# Patient Record
Sex: Female | Born: 1985 | Race: White | Hispanic: No | Marital: Single | Smoking: Never smoker
Health system: Southern US, Community
[De-identification: ages and names within clinical notes are randomized; demographics above are authoritative.]

## PROBLEM LIST (undated history)

## (undated) DIAGNOSIS — E063 Autoimmune thyroiditis: Secondary | ICD-10-CM

## (undated) HISTORY — PX: OTHER SURGICAL HISTORY: SHX169

---

## 2007-02-11 ENCOUNTER — Emergency Department (HOSPITAL_COMMUNITY): Admission: EM | Admit: 2007-02-11 | Discharge: 2007-02-11 | Payer: Self-pay | Admitting: Emergency Medicine

## 2007-02-17 ENCOUNTER — Ambulatory Visit (HOSPITAL_COMMUNITY): Admission: RE | Admit: 2007-02-17 | Discharge: 2007-02-18 | Payer: Self-pay | Admitting: Otolaryngology

## 2017-11-29 ENCOUNTER — Encounter: Payer: Self-pay | Admitting: Podiatry

## 2017-11-29 ENCOUNTER — Ambulatory Visit (INDEPENDENT_AMBULATORY_CARE_PROVIDER_SITE_OTHER): Payer: Medicaid Other

## 2017-11-29 ENCOUNTER — Ambulatory Visit: Payer: Medicaid Other | Admitting: Podiatry

## 2017-11-29 VITALS — BP 96/63 | HR 79 | Ht 67.0 in | Wt 158.0 lb

## 2017-11-29 DIAGNOSIS — M722 Plantar fascial fibromatosis: Secondary | ICD-10-CM | POA: Diagnosis not present

## 2017-11-29 MED ORDER — MELOXICAM 15 MG PO TABS: 15.0000 mg | ORAL_TABLET | Freq: Every day | ORAL | 0 refills | Status: DC

## 2017-11-29 NOTE — Progress Notes (Addendum)
  Subjective:  Patient ID: Kayla Hicks, female    DOB: [birth year],  MRN: [id number]  Chief Complaint  Patient presents with  . Foot Pain    Right foot pain pain moves around been having for years also has pain in left but is bearable     31 y.o. female presents with the above complaint.  Reports right foot pain greater than left.  States that the pain is in different locations of the foot but states it is worst on the right heel.  Also reports forefoot pain.  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   Current Outpatient Medications:  .  levothyroxine (SYNTHROID, LEVOTHROID) 75 MCG tablet, Take 75 mcg by mouth daily before breakfast., Disp: , Rfl:  .  sertraline (ZOLOFT) 100 MG tablet, Take 100 mg by mouth daily., Disp: , Rfl:  .  meloxicam (MOBIC) 15 MG tablet, Take 1 tablet (15 mg total) by mouth daily., Disp: 30 tablet, Rfl: 0  Allergies  Allergen Reactions  . Citric Acid Itching and Swelling   Review of Systems negative except as noted in HPI Objective:   Vitals:   11/29/17 1132  BP: 96/63  Pulse: 79   General AA&O x3. Normal mood and affect.  Vascular Dorsalis pedis and posterior tibial pulses  present 2+ bilaterally  Capillary refill normal to all digits. Pedal hair growth normal.  Neurologic Epicritic sensation grossly present.  Dermatologic No open lesions. Interspaces clear of maceration. Nails well groomed and normal in appearance.  Orthopedic: MMT 5/5 in dorsiflexion, plantarflexion, inversion, and eversion. Normal joint ROM without pain or crepitus. Pain to palpation right medial calcaneal tuber   She was taking reviewed.  No acute osseous abnormality.  No fracture dislocations. Assessment & Plan:  Patient was evaluated and treated and all questions answered.  Plantar Fasciitis, right - XR reviewed as above.  - Educated on icing and stretching.  - Injection delivered to the plantar fascia as below. -Rx meloxicam  Procedure: Injection  Tendon/Ligament Location: Right plantar fascia at the glabrous junction; medial approach. Skin Prep: Alcohol. Injectate: 1 cc 0.5% marcaine plain, 1 cc dexamethasone phosphate, 0.5 cc kenalog 10. Disposition: Patient tolerated procedure well. Injection site dressed with a band-aid.  Return in about 3 weeks (around 12/20/2017) for Plantar fasciitis.

## 2017-12-27 ENCOUNTER — Ambulatory Visit: Payer: Medicaid Other | Admitting: Podiatry

## 2018-05-04 DIAGNOSIS — M25512 Pain in left shoulder: Secondary | ICD-10-CM | POA: Diagnosis not present

## 2018-05-04 DIAGNOSIS — Z6824 Body mass index (BMI) 24.0-24.9, adult: Secondary | ICD-10-CM | POA: Diagnosis not present

## 2018-05-04 DIAGNOSIS — M62838 Other muscle spasm: Secondary | ICD-10-CM | POA: Diagnosis not present

## 2019-10-05 DIAGNOSIS — Z6824 Body mass index (BMI) 24.0-24.9, adult: Secondary | ICD-10-CM | POA: Diagnosis not present

## 2019-10-05 DIAGNOSIS — R300 Dysuria: Secondary | ICD-10-CM | POA: Diagnosis not present

## 2019-10-05 DIAGNOSIS — F39 Unspecified mood [affective] disorder: Secondary | ICD-10-CM | POA: Diagnosis not present

## 2019-11-06 ENCOUNTER — Other Ambulatory Visit: Payer: Self-pay

## 2019-11-06 DIAGNOSIS — Z20822 Contact with and (suspected) exposure to covid-19: Secondary | ICD-10-CM

## 2019-11-07 LAB — NOVEL CORONAVIRUS, NAA: SARS-CoV-2, NAA: NOT DETECTED

## 2019-11-13 DIAGNOSIS — F988 Other specified behavioral and emotional disorders with onset usually occurring in childhood and adolescence: Secondary | ICD-10-CM | POA: Diagnosis not present

## 2019-11-13 DIAGNOSIS — F39 Unspecified mood [affective] disorder: Secondary | ICD-10-CM | POA: Diagnosis not present

## 2019-11-13 DIAGNOSIS — G4710 Hypersomnia, unspecified: Secondary | ICD-10-CM | POA: Diagnosis not present

## 2019-11-13 DIAGNOSIS — N898 Other specified noninflammatory disorders of vagina: Secondary | ICD-10-CM | POA: Diagnosis not present

## 2019-12-11 ENCOUNTER — Other Ambulatory Visit: Payer: Self-pay

## 2019-12-11 DIAGNOSIS — Z20822 Contact with and (suspected) exposure to covid-19: Secondary | ICD-10-CM

## 2019-12-12 LAB — NOVEL CORONAVIRUS, NAA: SARS-CoV-2, NAA: NOT DETECTED

## 2020-01-09 DIAGNOSIS — F39 Unspecified mood [affective] disorder: Secondary | ICD-10-CM | POA: Diagnosis not present

## 2020-01-09 DIAGNOSIS — E785 Hyperlipidemia, unspecified: Secondary | ICD-10-CM | POA: Diagnosis not present

## 2020-01-09 DIAGNOSIS — F988 Other specified behavioral and emotional disorders with onset usually occurring in childhood and adolescence: Secondary | ICD-10-CM | POA: Diagnosis not present

## 2020-01-09 DIAGNOSIS — Z87891 Personal history of nicotine dependence: Secondary | ICD-10-CM | POA: Diagnosis not present

## 2020-01-18 DIAGNOSIS — G4710 Hypersomnia, unspecified: Secondary | ICD-10-CM | POA: Diagnosis not present

## 2020-01-18 DIAGNOSIS — G2581 Restless legs syndrome: Secondary | ICD-10-CM | POA: Diagnosis not present

## 2020-01-18 DIAGNOSIS — E039 Hypothyroidism, unspecified: Secondary | ICD-10-CM | POA: Diagnosis not present

## 2020-01-18 DIAGNOSIS — F329 Major depressive disorder, single episode, unspecified: Secondary | ICD-10-CM | POA: Diagnosis not present

## 2020-01-23 DIAGNOSIS — Z1151 Encounter for screening for human papillomavirus (HPV): Secondary | ICD-10-CM | POA: Diagnosis not present

## 2020-01-23 DIAGNOSIS — N750 Cyst of Bartholin's gland: Secondary | ICD-10-CM | POA: Diagnosis not present

## 2020-01-23 DIAGNOSIS — Z124 Encounter for screening for malignant neoplasm of cervix: Secondary | ICD-10-CM | POA: Diagnosis not present

## 2020-01-23 DIAGNOSIS — R87610 Atypical squamous cells of undetermined significance on cytologic smear of cervix (ASC-US): Secondary | ICD-10-CM | POA: Diagnosis not present

## 2020-01-23 DIAGNOSIS — Z30432 Encounter for removal of intrauterine contraceptive device: Secondary | ICD-10-CM | POA: Diagnosis not present

## 2020-02-08 DIAGNOSIS — Z791 Long term (current) use of non-steroidal anti-inflammatories (NSAID): Secondary | ICD-10-CM | POA: Diagnosis not present

## 2020-02-08 DIAGNOSIS — Z6825 Body mass index (BMI) 25.0-25.9, adult: Secondary | ICD-10-CM | POA: Diagnosis not present

## 2020-02-08 DIAGNOSIS — M159 Polyosteoarthritis, unspecified: Secondary | ICD-10-CM | POA: Diagnosis not present

## 2020-03-08 DIAGNOSIS — F988 Other specified behavioral and emotional disorders with onset usually occurring in childhood and adolescence: Secondary | ICD-10-CM | POA: Diagnosis not present

## 2020-03-08 DIAGNOSIS — E785 Hyperlipidemia, unspecified: Secondary | ICD-10-CM | POA: Diagnosis not present

## 2020-03-08 DIAGNOSIS — Z1331 Encounter for screening for depression: Secondary | ICD-10-CM | POA: Diagnosis not present

## 2020-03-08 DIAGNOSIS — F39 Unspecified mood [affective] disorder: Secondary | ICD-10-CM | POA: Diagnosis not present

## 2020-03-08 DIAGNOSIS — E039 Hypothyroidism, unspecified: Secondary | ICD-10-CM | POA: Diagnosis not present

## 2020-11-24 ENCOUNTER — Emergency Department (HOSPITAL_COMMUNITY)
Admission: EM | Admit: 2020-11-24 | Discharge: 2020-11-24 | Disposition: A | Discharge status: Home / Self Care | Payer: Medicaid Other | Attending: Emergency Medicine | Admitting: Emergency Medicine

## 2020-11-24 ENCOUNTER — Other Ambulatory Visit: Payer: Self-pay

## 2020-11-24 ENCOUNTER — Emergency Department (HOSPITAL_COMMUNITY): Payer: Medicaid Other

## 2020-11-24 ENCOUNTER — Encounter (HOSPITAL_COMMUNITY): Payer: Self-pay

## 2020-11-24 DIAGNOSIS — R1013 Epigastric pain: Secondary | ICD-10-CM | POA: Insufficient documentation

## 2020-11-24 DIAGNOSIS — Z20822 Contact with and (suspected) exposure to covid-19: Secondary | ICD-10-CM | POA: Diagnosis not present

## 2020-11-24 DIAGNOSIS — Z79899 Other long term (current) drug therapy: Secondary | ICD-10-CM | POA: Diagnosis not present

## 2020-11-24 DIAGNOSIS — R1011 Right upper quadrant pain: Secondary | ICD-10-CM | POA: Diagnosis present

## 2020-11-24 HISTORY — DX: Autoimmune thyroiditis: E06.3

## 2020-11-24 LAB — I-STAT CHEM 8, ED
BUN: 12 mg/dL (ref 6–20)
Calcium, Ion: 1.15 mmol/L (ref 1.15–1.40)
Chloride: 101 mmol/L (ref 98–111)
Creatinine, Ser: 0.6 mg/dL (ref 0.44–1.00)
Glucose, Bld: 131 mg/dL — ABNORMAL HIGH (ref 70–99)
HCT: 38 % (ref 36.0–46.0)
Hemoglobin: 12.9 g/dL (ref 12.0–15.0)
Potassium: 3.8 mmol/L (ref 3.5–5.1)
Sodium: 138 mmol/L (ref 135–145)
TCO2: 24 mmol/L (ref 22–32)

## 2020-11-24 LAB — COMPREHENSIVE METABOLIC PANEL
ALT: 44 U/L (ref 0–44)
AST: 75 U/L — ABNORMAL HIGH (ref 15–41)
Albumin: 4.1 g/dL (ref 3.5–5.0)
Alkaline Phosphatase: 67 U/L (ref 38–126)
Anion gap: 12 (ref 5–15)
BUN: 14 mg/dL (ref 6–20)
CO2: 24 mmol/L (ref 22–32)
Calcium: 8.9 mg/dL (ref 8.9–10.3)
Chloride: 100 mmol/L (ref 98–111)
Creatinine, Ser: 0.77 mg/dL (ref 0.44–1.00)
GFR, Estimated: >60 mL/min (ref >60)
Glucose, Bld: 137 mg/dL — ABNORMAL HIGH (ref 70–99)
Potassium: 3.9 mmol/L (ref 3.5–5.1)
Sodium: 136 mmol/L (ref 135–145)
Total Bilirubin: 0.5 mg/dL (ref 0.3–1.2)
Total Protein: 7 g/dL (ref 6.5–8.1)

## 2020-11-24 LAB — LACTIC ACID, PLASMA: Lactic Acid, Venous: 1.8 mmol/L (ref 0.5–1.9)

## 2020-11-24 LAB — RESP PANEL BY RT-PCR (FLU A&B, COVID) ARPGX2
Influenza A by PCR: NEGATIVE
Influenza B by PCR: NEGATIVE
SARS Coronavirus 2 by RT PCR: NEGATIVE

## 2020-11-24 LAB — CBC
HCT: 41.8 % (ref 36.0–46.0)
Hemoglobin: 14.1 g/dL (ref 12.0–15.0)
MCH: 30.3 pg (ref 26.0–34.0)
MCHC: 33.7 g/dL (ref 30.0–36.0)
MCV: 89.9 fL (ref 80.0–100.0)
Platelets: 230 10*3/uL (ref 150–400)
RBC: 4.65 MIL/uL (ref 3.87–5.11)
RDW: 12.4 % (ref 11.5–15.5)
WBC: 11.3 10*3/uL — ABNORMAL HIGH (ref 4.0–10.5)
nRBC: 0 % (ref 0.0–0.2)

## 2020-11-24 LAB — LIPASE, BLOOD: Lipase: 36 U/L (ref 11–51)

## 2020-11-24 LAB — I-STAT BETA HCG BLOOD, ED (MC, WL, AP ONLY): I-stat hCG, quantitative: <5 m[IU]/mL (ref <5)

## 2020-11-24 MED ORDER — BUPRENORPHINE HCL-NALOXONE HCL 8-2 MG SL SUBL
1.0000 | SUBLINGUAL_TABLET | Freq: Every day | SUBLINGUAL | Status: DC
Start: 2020-11-24 — End: 2020-11-24
  Administered 2020-11-24: 1 via SUBLINGUAL
  Filled 2020-11-24: qty 1

## 2020-11-24 NOTE — ED Notes (Signed)
Pt in room getting undressed at this time with no assistance

## 2020-11-24 NOTE — ED Notes (Signed)
MD in room at this time.

## 2020-11-24 NOTE — ED Triage Notes (Signed)
Pt presents with bilateral upper quadrant pain for the past 6 hours, worse on the left. Pt denies any N/V/D.

## 2020-11-24 NOTE — ED Notes (Signed)
Pharmacy in room at this time.

## 2020-11-24 NOTE — ED Provider Notes (Signed)
Kayla Hicks Provider Note   CSN: [id number] Arrival date & time: 11/24/20  0254     History Chief Complaint  Patient presents with  . Abdominal Pain    Kayla Hicks is a 34 y.o. female.  The history is provided by the patient and medical records. No language interpreter was used.  Abdominal Pain Pain location:  RUQ and epigastric Pain quality: aching and cramping   Pain radiates to:  Back Pain severity:  Severe Onset quality:  Gradual Duration:  6 hours Timing:  Constant Progression:  Unchanged Chronicity:  New Context: not alcohol use, not eating, not recent illness, not retching and not trauma   Relieved by:  Nothing Worsened by:  Palpation and deep breathing Ineffective treatments:  None tried Associated symptoms: no belching, no chest pain, no chills, no constipation, no cough, no diarrhea, no dysuria, no fatigue, no fever, no nausea, no shortness of breath, no vaginal bleeding, no vaginal discharge and no vomiting   Risk factors: no recent hospitalization        Past Medical History:  Diagnosis Date  . Hashimoto's disease     There are no problems to display for this patient.   Past Surgical History:  Procedure Laterality Date  . CESAREAN SECTION    . Head surgery       OB History   No obstetric history on file.     History reviewed. No pertinent family history.  Social History   Tobacco Use  . Smoking status: Never Smoker  . Smokeless tobacco: Never Used  Substance Use Topics  . Alcohol use: Never  . Drug use: Never    Home Medications Prior to Admission medications   Medication Sig Start Date End Date Taking? Authorizing Provider  levothyroxine (SYNTHROID, LEVOTHROID) 75 MCG tablet Take 75 mcg by mouth daily before breakfast.    [provider]  meloxicam (MOBIC) 15 MG tablet Take 1 tablet (15 mg total) by mouth daily. 11/29/17   Park Liter, DPM  sertraline (ZOLOFT) 100 MG tablet Take  100 mg by mouth daily.    [provider]    Allergies    Citric acid  Review of Systems   Review of Systems  Constitutional: Negative for chills, diaphoresis, fatigue and fever.  HENT: Negative for congestion.   Respiratory: Negative for cough, chest tightness, shortness of breath and wheezing.   Cardiovascular: Negative for chest pain, palpitations and leg swelling.  Gastrointestinal: Positive for abdominal pain. Negative for abdominal distention, constipation, diarrhea, nausea and vomiting.  Genitourinary: Negative for dysuria, flank pain, frequency, pelvic pain, vaginal bleeding and vaginal discharge.  Musculoskeletal: Negative for back pain, neck pain and neck stiffness.  Skin: Negative for rash and wound.  Neurological: Negative for light-headedness and headaches.  Psychiatric/Behavioral: Negative for agitation and confusion.  All other systems reviewed and are negative.   Physical Exam Updated Vital Signs BP 115/75 (BP Location: Left Arm)   Pulse 74   Temp 97.6 F (36.4 C) (Oral)   Resp 16   Ht 5\' 7"  (1.702 m)   Wt 73.9 kg   LMP 11/03/2020 (Approximate)   SpO2 97%   BMI 25.53 kg/m   Physical Exam Vitals and nursing note reviewed.  Constitutional:      General: She is not in acute distress.    Appearance: She is well-developed. She is not ill-appearing, toxic-appearing or diaphoretic.  HENT:     Head: Normocephalic and atraumatic.     Right Ear:  External ear normal.     Left Ear: External ear normal.     Nose: Nose normal.  Eyes:     General: No scleral icterus.    Extraocular Movements: Extraocular movements intact.     Conjunctiva/sclera: Conjunctivae normal.  Cardiovascular:     Rate and Rhythm: Normal rate.     Heart sounds: Normal heart sounds. No murmur heard.   Pulmonary:     Effort: Pulmonary effort is normal. No respiratory distress.     Breath sounds: No stridor. No wheezing, rhonchi or rales.  Chest:     Chest wall: No tenderness.   Abdominal:     General: Abdomen is flat. Bowel sounds are normal. There is no distension. There are no signs of injury.     Tenderness: There is abdominal tenderness in the right upper quadrant and epigastric area. There is no right CVA tenderness, left CVA tenderness or rebound.  Musculoskeletal:     Cervical back: Normal range of motion and neck supple.  Skin:    General: Skin is warm.     Findings: No erythema or rash.  Neurological:     Mental Status: She is alert and oriented to person, place, and time.     Motor: No abnormal muscle tone.     Coordination: Coordination normal.     Deep Tendon Reflexes: Reflexes are normal and symmetric.  Psychiatric:        Mood and Affect: Mood normal.     ED Results / Procedures / Treatments   Labs (all labs ordered are listed, but only abnormal results are displayed) Labs Reviewed  COMPREHENSIVE METABOLIC PANEL - Abnormal; Notable for the following components:      Result Value   Glucose, Bld 137 (*)    AST 75 (*)    All other components within normal limits  CBC - Abnormal; Notable for the following components:   WBC 11.3 (*)    All other components within normal limits  I-STAT CHEM 8, ED - Abnormal; Notable for the following components:   Glucose, Bld 131 (*)    All other components within normal limits  RESP PANEL BY RT-PCR (FLU A&B, COVID) ARPGX2  LIPASE, BLOOD  LACTIC ACID, PLASMA  URINALYSIS, ROUTINE W REFLEX MICROSCOPIC  I-STAT BETA HCG BLOOD, ED ([facility], [facility], [facility] ONLY)    EKG None  Radiology US Abdomen Limited RUQ (LIVER/GB)  Result Date: 11/24/2020 CLINICAL DATA:  Upper abdominal pain EXAM: ULTRASOUND ABDOMEN LIMITED RIGHT UPPER QUADRANT COMPARISON:  None. FINDINGS: Gallbladder: No gallstones or wall thickening visualized. There is no pericholecystic fluid. No sonographic Murphy sign noted by sonographer. Common bile duct: Diameter: 4 mm. No intrahepatic or extrahepatic biliary duct dilatation. Liver: No focal lesion  identified. Within normal limits in parenchymal echogenicity. Portal vein is patent on color Doppler imaging with normal direction of blood flow towards the liver. Other: None. IMPRESSION: Study within normal limits. Electronically Signed   By: Bretta BangWilliam  Woodruff III M.D.   On: 11/24/2020 08:48    Procedures Procedures (including critical care time)  Medications Ordered in ED Medications  buprenorphine-naloxone (SUBOXONE) 8-2 mg per SL tablet 1 tablet (1 tablet Sublingual Given 11/24/20 0902)    ED Course  I have reviewed the triage vital signs and the nursing notes.  Pertinent labs & imaging results that were available during my care of the patient were reviewed by me and considered in my medical decision making (see chart for details).    MDM Rules/Calculators/A&P  ANAYS DETORE is a 34 y.o. female with a past medical history sniffing for Hashimoto's disease who presents with severe upper abdominal pain.  Patient reports that for the last 6 hours, she been having severe pain in her right upper quadrant and epigastric area.  She has never had this pain before.  She reports did not go to sleep and start having pain right before she was going to bed.  She describes the pain as 8 out of 10 in severity and aching tightness in her right upper quadrant primarily.  It radiates around the side towards her back but there is no rash seen.  She denies any trauma.  She reports has had no nausea or vomiting and has not had any constipation or diarrhea.  She had normal bowel movement since then.  She reports her menstrual cycle is due next week but denies any pelvic symptoms.  There is no lower abdominal pain.  She denies any hematuria, dysuria, or other urinary complaints.  She denies fevers or chills.  She reports it does hurt in her right upper quadrants take a deep breath but denies any chest pain or shortness of breath.  Denies any other complaints.  She takes Suboxone and  reports that she uses it every other day or so.  She had not yet taken it today.  On exam, lungs clear and chest nontender.  Her right upper quadrant is tender to palpation as of the epigastrium.  There are bowel sounds.  Back is nontender and flanks nontender.  No rash seen to indicate shingles.  Good pulses in extremities.  Patient is resting comfortably but in severe pain in her abdomen.  Clinically I am primarily concerned with ruling out acute cholecystitis.  She does say that several years ago she had ultrasounds to work-up her gallbladder and they want to do a second ultrasound which she could not afford.  She does not know if she has stones or not.  She reports no recent spicy food or different oral intake and denies any recent alcohol use.  Primarily suspect gallbladder versus a ulcer or gastritis.  Given lack of any vaginal symptoms or lower abdominal discomfort, low suspicion for a GYN or pelvic cause of symptoms.  Will hold on initial pelvic exam given the upper abdominal discomfort.  We will get screening labs as well as right upper quadrant ultrasound initially.  If ultrasound is negative and she still hurting severely, anticipate consideration for CT scan to look for atypical diverticulitis or other cause of the upper abdominal pain.  Blood pressure is not elevated, doubt aortic etiology.  I spoke with pharmacy who will help order a first dose of a home Suboxone equivalent.  If this does not improve her pain significantly, we will likely add other pain medicine.  We will test for Covid anticipating she may need to go to the OR for surgery.  Anticipate reassessment for work-up.  11:17 AM Ultrasound returned showing no evidence of acute cholecystitis or any gallbladder wall thickening or stones.  Labs show mild leukocytosis but otherwise reassuring.  Patient reassessed and she is completely symptom-free.  She is doing much better.  We had a shared decision-making conversation and agreed to  not pursue CT scan or further work-up at this time.  She will follow-up with her PCP and understand extremely strict return precautions.  We discussed the possibility of multiple other causes that if symptoms return she may need to come back to complete work-up with further  imaging.  She had no other questions or concerns and was discharged in good condition with resolved symptoms.   Final Clinical Impression(s) / ED Diagnoses Final diagnoses:  RUQ abdominal pain  Epigastric pain    Rx / DC Orders ED Discharge Orders    None      Clinical Impression: 1. Epigastric pain   2. RUQ abdominal pain     Disposition: Discharge  Condition: Good  I have discussed the results, Dx and Tx plan with the pt(& family if present). He/she/they expressed understanding and agree(s) with the plan. Discharge instructions discussed at great length. Strict return precautions discussed and pt &/or family have verbalized understanding of the instructions. No further questions at time of discharge.    New Prescriptions   No medications on file    Follow Up: Center, [facility] [address] [place] [address] [phone]     [facility] [address] [phone] [facility] [address] [place] [address] [phone]         Monti Jilek, Canary Brim, MD 11/24/20 1120

## 2020-11-24 NOTE — Discharge Instructions (Addendum)
Your work-up today was overall reassuring.  The ultrasound did not show any evidence of stones in your gallbladder or gallbladder abnormalities.  Your labs only showed the slight elevated white blood cell count but otherwise reassuring.  Please rest and stay hydrated and follow-up with your primary doctor.  If any symptoms change or worsen, return to the nearest emergency department.

## 2021-02-06 DIAGNOSIS — R69 Illness, unspecified: Secondary | ICD-10-CM | POA: Diagnosis not present

## 2021-02-06 DIAGNOSIS — Z6825 Body mass index (BMI) 25.0-25.9, adult: Secondary | ICD-10-CM | POA: Diagnosis not present

## 2021-03-06 DIAGNOSIS — Z6825 Body mass index (BMI) 25.0-25.9, adult: Secondary | ICD-10-CM | POA: Diagnosis not present

## 2021-03-06 DIAGNOSIS — R69 Illness, unspecified: Secondary | ICD-10-CM | POA: Diagnosis not present

## 2021-03-20 DIAGNOSIS — R69 Illness, unspecified: Secondary | ICD-10-CM | POA: Diagnosis not present

## 2021-03-20 DIAGNOSIS — F419 Anxiety disorder, unspecified: Secondary | ICD-10-CM | POA: Diagnosis not present

## 2021-03-20 DIAGNOSIS — F909 Attention-deficit hyperactivity disorder, unspecified type: Secondary | ICD-10-CM | POA: Diagnosis not present

## 2021-04-03 DIAGNOSIS — Z6825 Body mass index (BMI) 25.0-25.9, adult: Secondary | ICD-10-CM | POA: Diagnosis not present

## 2021-04-03 DIAGNOSIS — R69 Illness, unspecified: Secondary | ICD-10-CM | POA: Diagnosis not present

## 2021-04-03 DIAGNOSIS — Z79891 Long term (current) use of opiate analgesic: Secondary | ICD-10-CM | POA: Diagnosis not present

## 2021-04-21 DIAGNOSIS — F909 Attention-deficit hyperactivity disorder, unspecified type: Secondary | ICD-10-CM | POA: Diagnosis not present

## 2021-04-21 DIAGNOSIS — F419 Anxiety disorder, unspecified: Secondary | ICD-10-CM | POA: Diagnosis not present

## 2021-04-21 DIAGNOSIS — R69 Illness, unspecified: Secondary | ICD-10-CM | POA: Diagnosis not present

## 2021-04-28 DIAGNOSIS — E038 Other specified hypothyroidism: Secondary | ICD-10-CM | POA: Diagnosis not present

## 2021-04-28 DIAGNOSIS — R69 Illness, unspecified: Secondary | ICD-10-CM | POA: Diagnosis not present

## 2021-04-28 DIAGNOSIS — Z1159 Encounter for screening for other viral diseases: Secondary | ICD-10-CM | POA: Diagnosis not present

## 2021-04-28 DIAGNOSIS — Z114 Encounter for screening for human immunodeficiency virus [HIV]: Secondary | ICD-10-CM | POA: Diagnosis not present

## 2021-04-28 DIAGNOSIS — E063 Autoimmune thyroiditis: Secondary | ICD-10-CM | POA: Diagnosis not present

## 2021-04-28 DIAGNOSIS — R7401 Elevation of levels of liver transaminase levels: Secondary | ICD-10-CM | POA: Diagnosis not present

## 2021-04-28 DIAGNOSIS — R5382 Chronic fatigue, unspecified: Secondary | ICD-10-CM | POA: Diagnosis not present

## 2021-05-01 DIAGNOSIS — R69 Illness, unspecified: Secondary | ICD-10-CM | POA: Diagnosis not present

## 2021-05-01 DIAGNOSIS — Z79891 Long term (current) use of opiate analgesic: Secondary | ICD-10-CM | POA: Diagnosis not present

## 2021-05-27 DIAGNOSIS — E663 Overweight: Secondary | ICD-10-CM | POA: Diagnosis not present

## 2021-05-27 DIAGNOSIS — R635 Abnormal weight gain: Secondary | ICD-10-CM | POA: Diagnosis not present

## 2021-05-27 DIAGNOSIS — Z3200 Encounter for pregnancy test, result unknown: Secondary | ICD-10-CM | POA: Diagnosis not present

## 2021-05-27 DIAGNOSIS — R69 Illness, unspecified: Secondary | ICD-10-CM | POA: Diagnosis not present

## 2021-05-30 DIAGNOSIS — R69 Illness, unspecified: Secondary | ICD-10-CM | POA: Diagnosis not present

## 2021-05-30 DIAGNOSIS — Z6825 Body mass index (BMI) 25.0-25.9, adult: Secondary | ICD-10-CM | POA: Diagnosis not present

## 2021-05-30 DIAGNOSIS — Z79891 Long term (current) use of opiate analgesic: Secondary | ICD-10-CM | POA: Diagnosis not present

## 2021-06-04 DIAGNOSIS — L299 Pruritus, unspecified: Secondary | ICD-10-CM | POA: Diagnosis not present

## 2021-06-09 DIAGNOSIS — M549 Dorsalgia, unspecified: Secondary | ICD-10-CM | POA: Diagnosis not present

## 2021-06-09 DIAGNOSIS — M79645 Pain in left finger(s): Secondary | ICD-10-CM | POA: Diagnosis not present

## 2021-06-09 DIAGNOSIS — S20219A Contusion of unspecified front wall of thorax, initial encounter: Secondary | ICD-10-CM | POA: Diagnosis not present

## 2021-06-09 DIAGNOSIS — Y92410 Unspecified street and highway as the place of occurrence of the external cause: Secondary | ICD-10-CM | POA: Diagnosis not present

## 2021-06-09 DIAGNOSIS — Y999 Unspecified external cause status: Secondary | ICD-10-CM | POA: Diagnosis not present

## 2021-06-09 DIAGNOSIS — S60011A Contusion of right thumb without damage to nail, initial encounter: Secondary | ICD-10-CM | POA: Diagnosis not present

## 2021-06-09 DIAGNOSIS — R52 Pain, unspecified: Secondary | ICD-10-CM | POA: Diagnosis not present

## 2021-06-09 DIAGNOSIS — M7989 Other specified soft tissue disorders: Secondary | ICD-10-CM | POA: Diagnosis not present

## 2021-06-09 DIAGNOSIS — M25531 Pain in right wrist: Secondary | ICD-10-CM | POA: Diagnosis not present

## 2021-06-09 DIAGNOSIS — M79603 Pain in arm, unspecified: Secondary | ICD-10-CM | POA: Diagnosis not present

## 2021-06-09 DIAGNOSIS — R079 Chest pain, unspecified: Secondary | ICD-10-CM | POA: Diagnosis not present

## 2021-06-09 DIAGNOSIS — M79601 Pain in right arm: Secondary | ICD-10-CM | POA: Diagnosis not present

## 2021-06-09 DIAGNOSIS — M79641 Pain in right hand: Secondary | ICD-10-CM | POA: Diagnosis not present

## 2021-06-09 DIAGNOSIS — N3289 Other specified disorders of bladder: Secondary | ICD-10-CM | POA: Diagnosis not present

## 2021-06-09 DIAGNOSIS — Z3202 Encounter for pregnancy test, result negative: Secondary | ICD-10-CM | POA: Diagnosis not present

## 2021-06-09 DIAGNOSIS — K429 Umbilical hernia without obstruction or gangrene: Secondary | ICD-10-CM | POA: Diagnosis not present

## 2021-06-09 DIAGNOSIS — Z5321 Procedure and treatment not carried out due to patient leaving prior to being seen by health care provider: Secondary | ICD-10-CM | POA: Diagnosis not present

## 2021-06-09 DIAGNOSIS — R609 Edema, unspecified: Secondary | ICD-10-CM | POA: Diagnosis not present

## 2021-06-09 DIAGNOSIS — M79632 Pain in left forearm: Secondary | ICD-10-CM | POA: Diagnosis not present

## 2021-06-12 DIAGNOSIS — R635 Abnormal weight gain: Secondary | ICD-10-CM | POA: Diagnosis not present

## 2021-06-12 DIAGNOSIS — Z79899 Other long term (current) drug therapy: Secondary | ICD-10-CM | POA: Diagnosis not present

## 2021-06-12 DIAGNOSIS — Z6828 Body mass index (BMI) 28.0-28.9, adult: Secondary | ICD-10-CM | POA: Diagnosis not present

## 2021-06-18 DIAGNOSIS — S20211A Contusion of right front wall of thorax, initial encounter: Secondary | ICD-10-CM | POA: Diagnosis not present

## 2021-06-19 DIAGNOSIS — R0789 Other chest pain: Secondary | ICD-10-CM | POA: Diagnosis not present

## 2021-06-19 DIAGNOSIS — M546 Pain in thoracic spine: Secondary | ICD-10-CM | POA: Diagnosis not present

## 2021-06-19 DIAGNOSIS — R69 Illness, unspecified: Secondary | ICD-10-CM | POA: Diagnosis not present

## 2021-06-19 DIAGNOSIS — M542 Cervicalgia: Secondary | ICD-10-CM | POA: Diagnosis not present

## 2021-06-23 DIAGNOSIS — R079 Chest pain, unspecified: Secondary | ICD-10-CM | POA: Diagnosis not present

## 2021-06-23 DIAGNOSIS — R0789 Other chest pain: Secondary | ICD-10-CM | POA: Diagnosis not present

## 2021-06-24 DIAGNOSIS — R000 Tachycardia, unspecified: Secondary | ICD-10-CM | POA: Diagnosis not present

## 2021-06-27 DIAGNOSIS — R69 Illness, unspecified: Secondary | ICD-10-CM | POA: Diagnosis not present

## 2021-06-27 DIAGNOSIS — Z79891 Long term (current) use of opiate analgesic: Secondary | ICD-10-CM | POA: Diagnosis not present

## 2021-07-10 DIAGNOSIS — Z79899 Other long term (current) drug therapy: Secondary | ICD-10-CM | POA: Diagnosis not present

## 2021-07-10 DIAGNOSIS — Z6828 Body mass index (BMI) 28.0-28.9, adult: Secondary | ICD-10-CM | POA: Diagnosis not present

## 2021-07-10 DIAGNOSIS — R635 Abnormal weight gain: Secondary | ICD-10-CM | POA: Diagnosis not present

## 2021-07-21 DIAGNOSIS — F419 Anxiety disorder, unspecified: Secondary | ICD-10-CM | POA: Diagnosis not present

## 2021-07-21 DIAGNOSIS — F909 Attention-deficit hyperactivity disorder, unspecified type: Secondary | ICD-10-CM | POA: Diagnosis not present

## 2021-07-21 DIAGNOSIS — R69 Illness, unspecified: Secondary | ICD-10-CM | POA: Diagnosis not present

## 2021-07-29 DIAGNOSIS — Z79891 Long term (current) use of opiate analgesic: Secondary | ICD-10-CM | POA: Diagnosis not present

## 2021-07-29 DIAGNOSIS — R69 Illness, unspecified: Secondary | ICD-10-CM | POA: Diagnosis not present

## 2021-08-25 DIAGNOSIS — Z3200 Encounter for pregnancy test, result unknown: Secondary | ICD-10-CM | POA: Diagnosis not present

## 2021-08-26 DIAGNOSIS — O99320 Drug use complicating pregnancy, unspecified trimester: Secondary | ICD-10-CM | POA: Diagnosis not present

## 2021-08-26 DIAGNOSIS — Z79891 Long term (current) use of opiate analgesic: Secondary | ICD-10-CM | POA: Diagnosis not present

## 2021-08-26 DIAGNOSIS — R69 Illness, unspecified: Secondary | ICD-10-CM | POA: Diagnosis not present

## 2021-08-26 DIAGNOSIS — F1910 Other psychoactive substance abuse, uncomplicated: Secondary | ICD-10-CM | POA: Diagnosis not present

## 2022-04-09 IMAGING — US US ABDOMEN LIMITED
1 series · 14 of 25 positions shown · non-contrast
Comparison: None.

CLINICAL DATA: Upper abdominal pain

EXAM:
ULTRASOUND ABDOMEN LIMITED RIGHT UPPER QUADRANT

[Series 1: us abdomen limited · 14 of 41 slices shown]
[im 1/41]
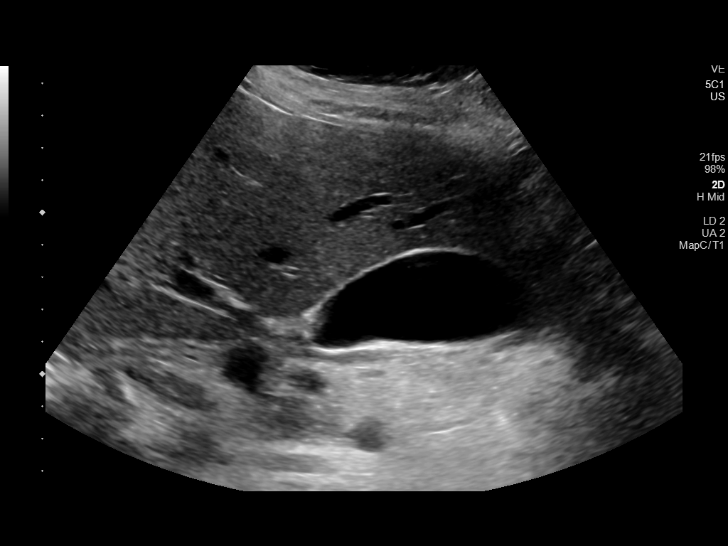
[im 4/41]
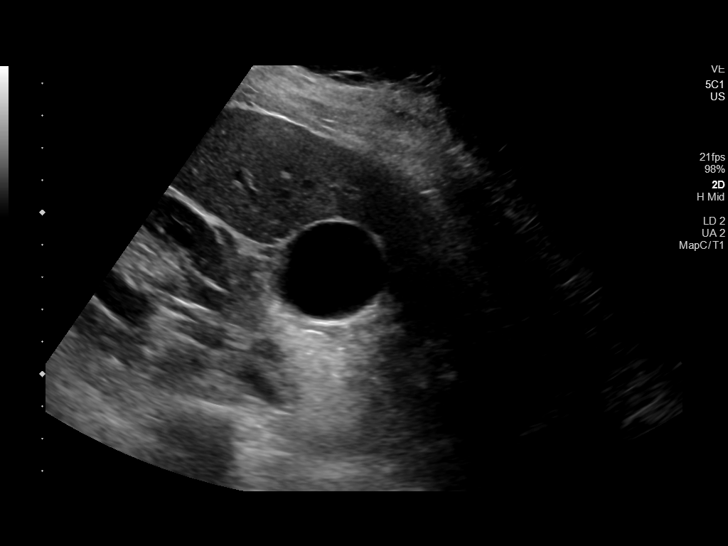
[im 7/41]
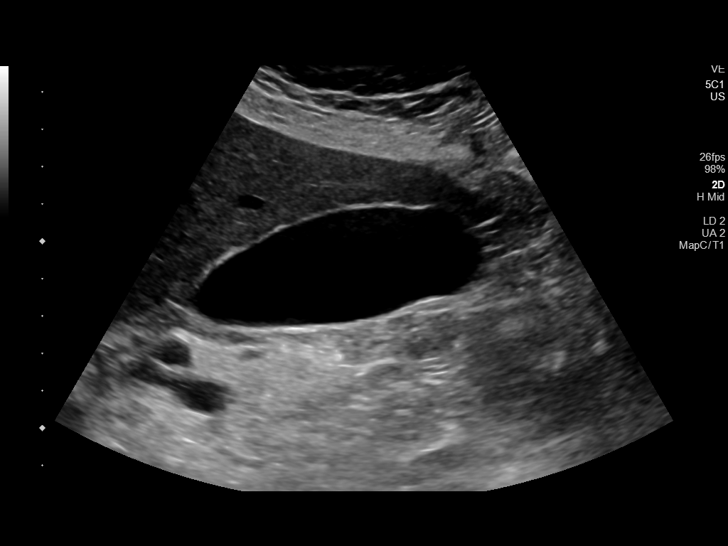
[im 11/41]
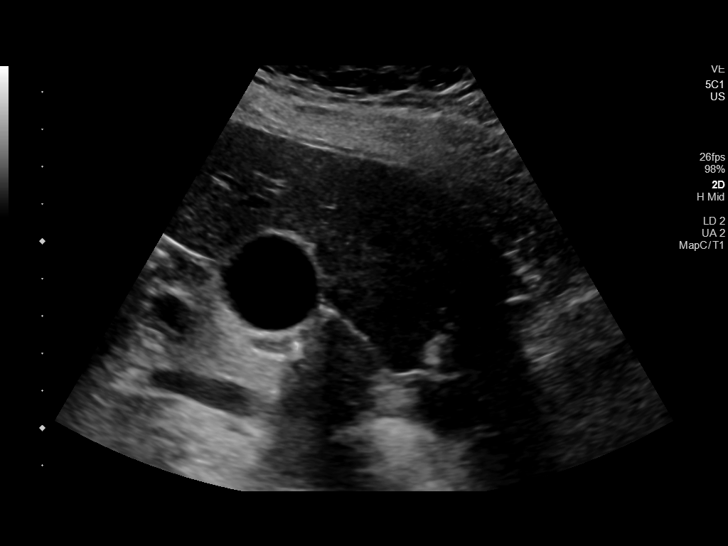
[im 14/41]
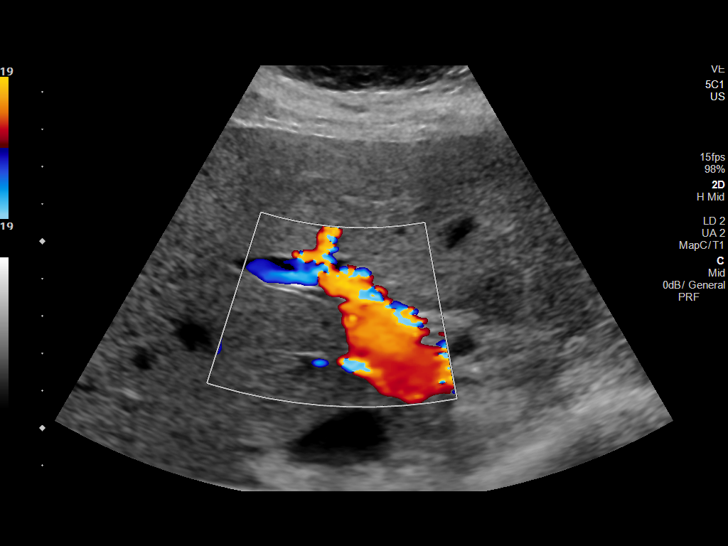
[im 16/41]
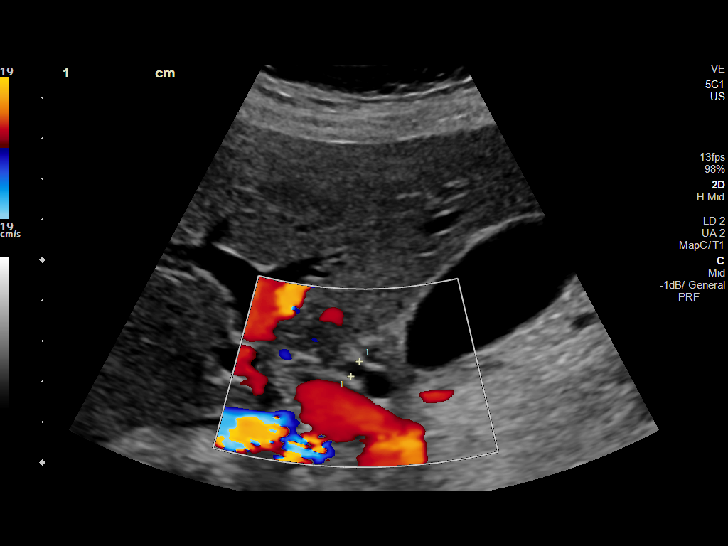
[im 19/41]
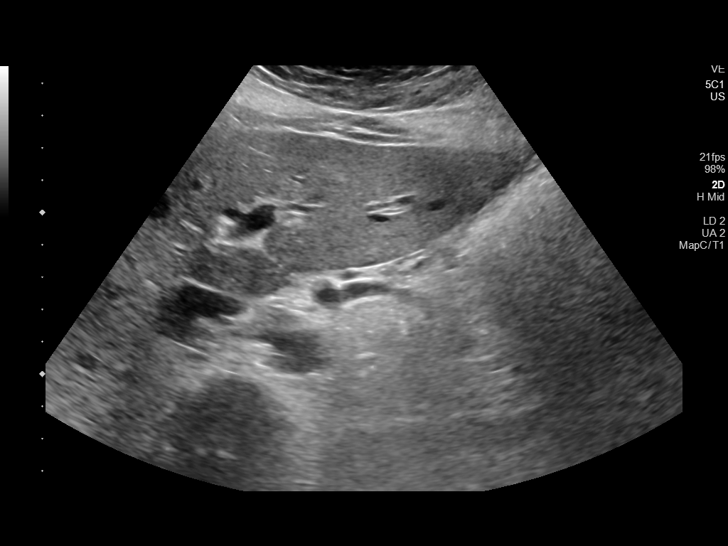
[im 22/41]
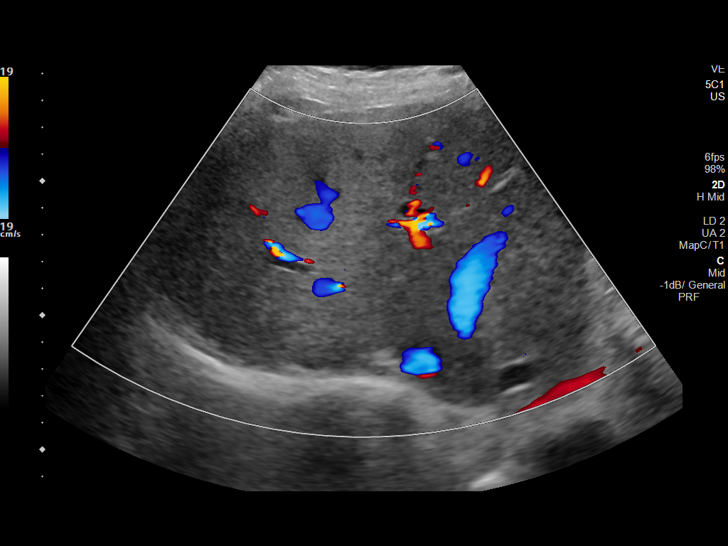
[im 26/41]
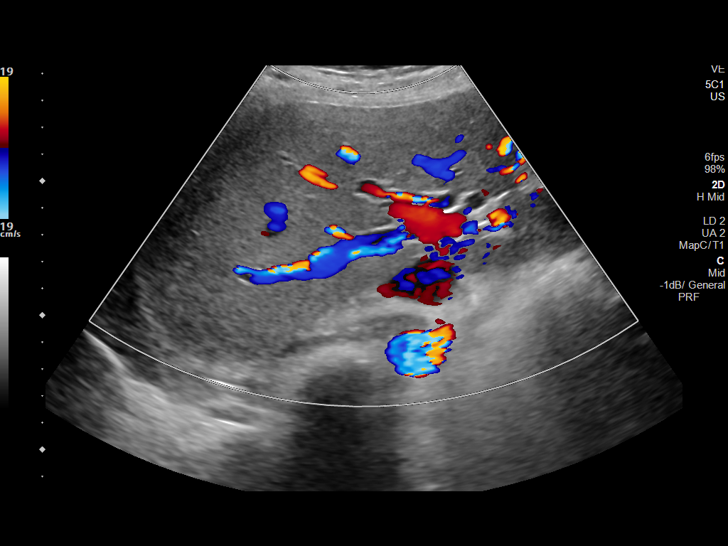
[im 27/41]
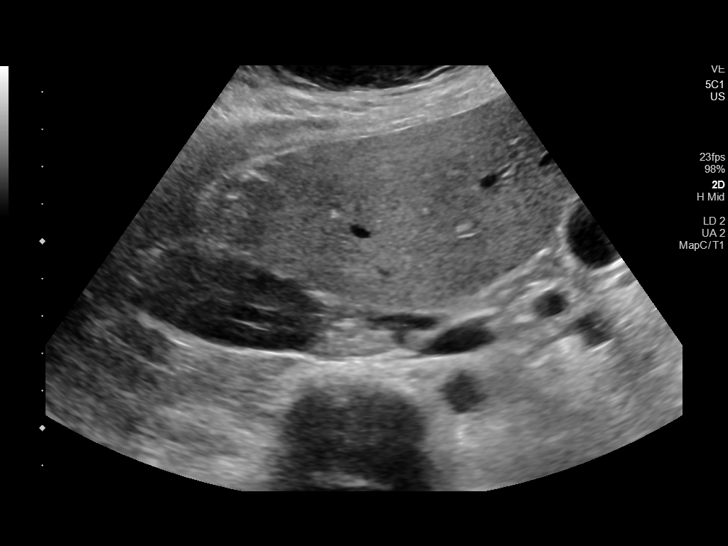
[im 31/41]
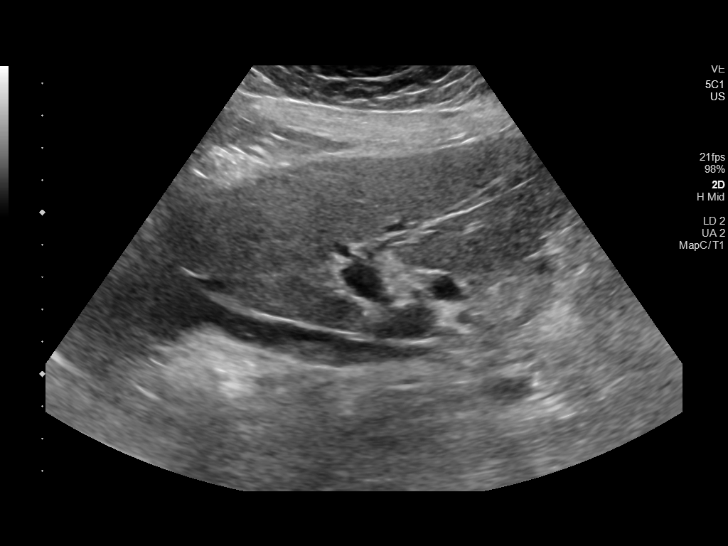
[im 34/41]
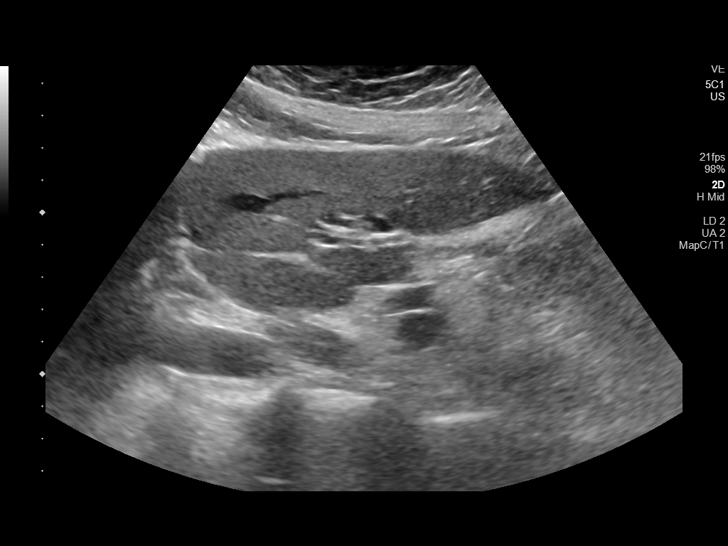
[im 37/41]
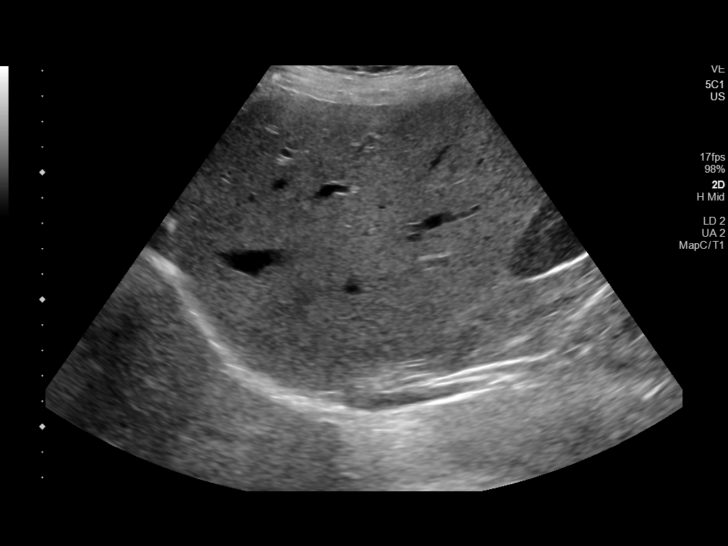
[im 41/41]
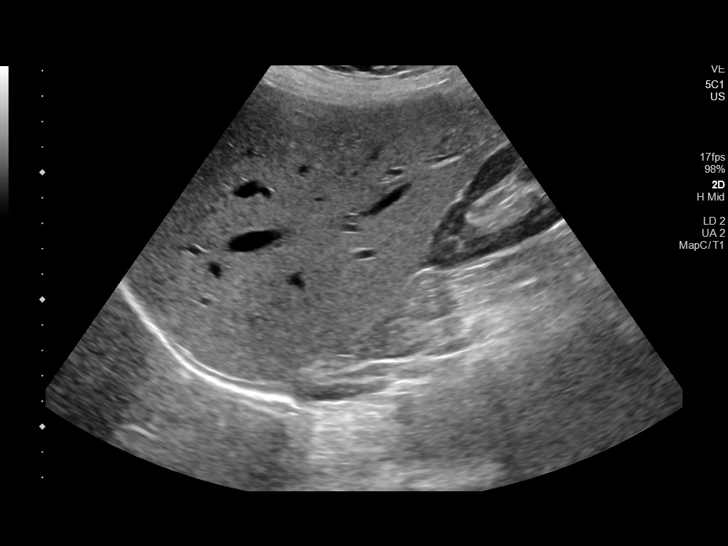

[14 of 25 positions shown; findings below may reference images not displayed]

FINDINGS: Gallbladder:

No gallstones or wall thickening visualized. There is no
pericholecystic fluid. No sonographic Murphy sign noted by
sonographer.

Common bile duct:

Diameter: 4 mm. No intrahepatic or extrahepatic biliary duct
dilatation.

Liver:

No focal lesion identified. Within normal limits in parenchymal
echogenicity. Portal vein is patent on color Doppler imaging with
normal direction of blood flow towards the liver.

Other: None.
IMPRESSION: Study within normal limits.

## 2022-10-19 DIAGNOSIS — F988 Other specified behavioral and emotional disorders with onset usually occurring in childhood and adolescence: Secondary | ICD-10-CM | POA: Diagnosis not present

## 2022-10-19 DIAGNOSIS — F39 Unspecified mood [affective] disorder: Secondary | ICD-10-CM | POA: Diagnosis not present

## 2022-10-19 DIAGNOSIS — Z6826 Body mass index (BMI) 26.0-26.9, adult: Secondary | ICD-10-CM | POA: Diagnosis not present

## 2022-10-19 DIAGNOSIS — M25511 Pain in right shoulder: Secondary | ICD-10-CM | POA: Diagnosis not present

## 2022-11-04 DIAGNOSIS — F331 Major depressive disorder, recurrent, moderate: Secondary | ICD-10-CM | POA: Diagnosis not present

## 2022-11-04 DIAGNOSIS — F411 Generalized anxiety disorder: Secondary | ICD-10-CM | POA: Diagnosis not present

## 2022-11-04 DIAGNOSIS — F1120 Opioid dependence, uncomplicated: Secondary | ICD-10-CM | POA: Diagnosis not present

## 2022-11-18 DIAGNOSIS — E063 Autoimmune thyroiditis: Secondary | ICD-10-CM | POA: Diagnosis not present

## 2022-11-18 DIAGNOSIS — E038 Other specified hypothyroidism: Secondary | ICD-10-CM | POA: Diagnosis not present

## 2022-11-27 DIAGNOSIS — J029 Acute pharyngitis, unspecified: Secondary | ICD-10-CM | POA: Diagnosis not present

## 2022-11-27 DIAGNOSIS — Z6826 Body mass index (BMI) 26.0-26.9, adult: Secondary | ICD-10-CM | POA: Diagnosis not present

## 2022-12-02 DIAGNOSIS — Z6826 Body mass index (BMI) 26.0-26.9, adult: Secondary | ICD-10-CM | POA: Diagnosis not present

## 2022-12-02 DIAGNOSIS — R635 Abnormal weight gain: Secondary | ICD-10-CM | POA: Diagnosis not present

## 2022-12-15 DIAGNOSIS — F1120 Opioid dependence, uncomplicated: Secondary | ICD-10-CM | POA: Diagnosis not present

## 2022-12-15 DIAGNOSIS — Z6826 Body mass index (BMI) 26.0-26.9, adult: Secondary | ICD-10-CM | POA: Diagnosis not present

## 2022-12-15 DIAGNOSIS — Z79891 Long term (current) use of opiate analgesic: Secondary | ICD-10-CM | POA: Diagnosis not present

## 2022-12-15 DIAGNOSIS — F331 Major depressive disorder, recurrent, moderate: Secondary | ICD-10-CM | POA: Diagnosis not present

## 2022-12-15 DIAGNOSIS — F411 Generalized anxiety disorder: Secondary | ICD-10-CM | POA: Diagnosis not present

## 2022-12-16 DIAGNOSIS — F39 Unspecified mood [affective] disorder: Secondary | ICD-10-CM | POA: Diagnosis not present

## 2022-12-16 DIAGNOSIS — F988 Other specified behavioral and emotional disorders with onset usually occurring in childhood and adolescence: Secondary | ICD-10-CM | POA: Diagnosis not present

## 2022-12-16 DIAGNOSIS — E785 Hyperlipidemia, unspecified: Secondary | ICD-10-CM | POA: Diagnosis not present

## 2022-12-16 DIAGNOSIS — F411 Generalized anxiety disorder: Secondary | ICD-10-CM | POA: Diagnosis not present

## 2023-01-05 DIAGNOSIS — F331 Major depressive disorder, recurrent, moderate: Secondary | ICD-10-CM | POA: Diagnosis not present

## 2023-01-05 DIAGNOSIS — F1910 Other psychoactive substance abuse, uncomplicated: Secondary | ICD-10-CM | POA: Diagnosis not present

## 2023-01-05 DIAGNOSIS — F1120 Opioid dependence, uncomplicated: Secondary | ICD-10-CM | POA: Diagnosis not present

## 2023-01-05 DIAGNOSIS — F411 Generalized anxiety disorder: Secondary | ICD-10-CM | POA: Diagnosis not present

## 2023-01-14 DIAGNOSIS — F411 Generalized anxiety disorder: Secondary | ICD-10-CM | POA: Diagnosis not present

## 2023-01-14 DIAGNOSIS — Z3689 Encounter for other specified antenatal screening: Secondary | ICD-10-CM | POA: Diagnosis not present

## 2023-01-15 DIAGNOSIS — F988 Other specified behavioral and emotional disorders with onset usually occurring in childhood and adolescence: Secondary | ICD-10-CM | POA: Diagnosis not present

## 2023-01-15 DIAGNOSIS — F39 Unspecified mood [affective] disorder: Secondary | ICD-10-CM | POA: Diagnosis not present

## 2023-01-15 DIAGNOSIS — E063 Autoimmune thyroiditis: Secondary | ICD-10-CM | POA: Diagnosis not present

## 2023-01-15 DIAGNOSIS — Z3491 Encounter for supervision of normal pregnancy, unspecified, first trimester: Secondary | ICD-10-CM | POA: Diagnosis not present

## 2023-01-15 DIAGNOSIS — E785 Hyperlipidemia, unspecified: Secondary | ICD-10-CM | POA: Diagnosis not present

## 2023-02-10 DIAGNOSIS — Z6827 Body mass index (BMI) 27.0-27.9, adult: Secondary | ICD-10-CM | POA: Diagnosis not present

## 2023-02-10 DIAGNOSIS — Z79891 Long term (current) use of opiate analgesic: Secondary | ICD-10-CM | POA: Diagnosis not present

## 2023-02-10 DIAGNOSIS — F1120 Opioid dependence, uncomplicated: Secondary | ICD-10-CM | POA: Diagnosis not present

## 2023-02-10 DIAGNOSIS — F411 Generalized anxiety disorder: Secondary | ICD-10-CM | POA: Diagnosis not present

## 2023-02-10 DIAGNOSIS — F331 Major depressive disorder, recurrent, moderate: Secondary | ICD-10-CM | POA: Diagnosis not present

## 2023-02-11 DIAGNOSIS — Z1379 Encounter for other screening for genetic and chromosomal anomalies: Secondary | ICD-10-CM | POA: Diagnosis not present

## 2023-02-11 DIAGNOSIS — O09519 Supervision of elderly primigravida, unspecified trimester: Secondary | ICD-10-CM | POA: Diagnosis not present

## 2023-02-11 DIAGNOSIS — O09522 Supervision of elderly multigravida, second trimester: Secondary | ICD-10-CM | POA: Diagnosis not present

## 2023-02-11 DIAGNOSIS — Z3490 Encounter for supervision of normal pregnancy, unspecified, unspecified trimester: Secondary | ICD-10-CM | POA: Diagnosis not present

## 2023-02-11 DIAGNOSIS — E0590 Thyrotoxicosis, unspecified without thyrotoxic crisis or storm: Secondary | ICD-10-CM | POA: Diagnosis not present

## 2023-02-15 DIAGNOSIS — F988 Other specified behavioral and emotional disorders with onset usually occurring in childhood and adolescence: Secondary | ICD-10-CM | POA: Diagnosis not present

## 2023-02-15 DIAGNOSIS — F331 Major depressive disorder, recurrent, moderate: Secondary | ICD-10-CM | POA: Diagnosis not present

## 2023-02-15 DIAGNOSIS — Z6827 Body mass index (BMI) 27.0-27.9, adult: Secondary | ICD-10-CM | POA: Diagnosis not present

## 2023-03-04 DIAGNOSIS — F331 Major depressive disorder, recurrent, moderate: Secondary | ICD-10-CM | POA: Diagnosis not present

## 2023-03-10 DIAGNOSIS — F411 Generalized anxiety disorder: Secondary | ICD-10-CM | POA: Diagnosis not present

## 2023-03-10 DIAGNOSIS — F1120 Opioid dependence, uncomplicated: Secondary | ICD-10-CM | POA: Diagnosis not present

## 2023-03-10 DIAGNOSIS — F331 Major depressive disorder, recurrent, moderate: Secondary | ICD-10-CM | POA: Diagnosis not present

## 2023-03-11 DIAGNOSIS — Z363 Encounter for antenatal screening for malformations: Secondary | ICD-10-CM | POA: Diagnosis not present

## 2023-03-31 DIAGNOSIS — F909 Attention-deficit hyperactivity disorder, unspecified type: Secondary | ICD-10-CM | POA: Diagnosis not present

## 2023-04-07 DIAGNOSIS — F331 Major depressive disorder, recurrent, moderate: Secondary | ICD-10-CM | POA: Diagnosis not present

## 2023-04-07 DIAGNOSIS — Z79891 Long term (current) use of opiate analgesic: Secondary | ICD-10-CM | POA: Diagnosis not present

## 2023-04-07 DIAGNOSIS — Z6828 Body mass index (BMI) 28.0-28.9, adult: Secondary | ICD-10-CM | POA: Diagnosis not present

## 2023-04-07 DIAGNOSIS — F411 Generalized anxiety disorder: Secondary | ICD-10-CM | POA: Diagnosis not present

## 2023-04-07 DIAGNOSIS — F1120 Opioid dependence, uncomplicated: Secondary | ICD-10-CM | POA: Diagnosis not present

## 2023-04-27 DIAGNOSIS — F909 Attention-deficit hyperactivity disorder, unspecified type: Secondary | ICD-10-CM | POA: Diagnosis not present

## 2023-04-27 DIAGNOSIS — O24410 Gestational diabetes mellitus in pregnancy, diet controlled: Secondary | ICD-10-CM | POA: Diagnosis not present

## 2023-05-04 DIAGNOSIS — O24419 Gestational diabetes mellitus in pregnancy, unspecified control: Secondary | ICD-10-CM | POA: Diagnosis not present

## 2023-05-04 DIAGNOSIS — O0992 Supervision of high risk pregnancy, unspecified, second trimester: Secondary | ICD-10-CM | POA: Diagnosis not present

## 2023-05-04 DIAGNOSIS — Z3A25 25 weeks gestation of pregnancy: Secondary | ICD-10-CM | POA: Diagnosis not present

## 2023-05-05 DIAGNOSIS — F331 Major depressive disorder, recurrent, moderate: Secondary | ICD-10-CM | POA: Diagnosis not present

## 2023-05-05 DIAGNOSIS — O99320 Drug use complicating pregnancy, unspecified trimester: Secondary | ICD-10-CM | POA: Diagnosis not present

## 2023-05-05 DIAGNOSIS — F411 Generalized anxiety disorder: Secondary | ICD-10-CM | POA: Diagnosis not present

## 2023-05-05 DIAGNOSIS — F1120 Opioid dependence, uncomplicated: Secondary | ICD-10-CM | POA: Diagnosis not present

## 2023-05-19 DIAGNOSIS — E038 Other specified hypothyroidism: Secondary | ICD-10-CM | POA: Diagnosis not present

## 2023-05-19 DIAGNOSIS — Z3A28 28 weeks gestation of pregnancy: Secondary | ICD-10-CM | POA: Diagnosis not present

## 2023-05-19 DIAGNOSIS — E063 Autoimmune thyroiditis: Secondary | ICD-10-CM | POA: Diagnosis not present

## 2023-05-19 DIAGNOSIS — O24419 Gestational diabetes mellitus in pregnancy, unspecified control: Secondary | ICD-10-CM | POA: Diagnosis not present

## 2023-05-20 DIAGNOSIS — Z3490 Encounter for supervision of normal pregnancy, unspecified, unspecified trimester: Secondary | ICD-10-CM | POA: Diagnosis not present

## 2023-05-20 DIAGNOSIS — Z23 Encounter for immunization: Secondary | ICD-10-CM | POA: Diagnosis not present

## 2023-05-20 DIAGNOSIS — O139 Gestational [pregnancy-induced] hypertension without significant proteinuria, unspecified trimester: Secondary | ICD-10-CM | POA: Diagnosis not present

## 2023-05-21 DIAGNOSIS — F331 Major depressive disorder, recurrent, moderate: Secondary | ICD-10-CM | POA: Diagnosis not present

## 2023-05-24 DIAGNOSIS — Z3490 Encounter for supervision of normal pregnancy, unspecified, unspecified trimester: Secondary | ICD-10-CM | POA: Diagnosis not present

## 2023-06-02 DIAGNOSIS — F411 Generalized anxiety disorder: Secondary | ICD-10-CM | POA: Diagnosis not present

## 2023-06-02 DIAGNOSIS — F331 Major depressive disorder, recurrent, moderate: Secondary | ICD-10-CM | POA: Diagnosis not present

## 2023-06-02 DIAGNOSIS — F1120 Opioid dependence, uncomplicated: Secondary | ICD-10-CM | POA: Diagnosis not present

## 2023-06-02 DIAGNOSIS — Z6829 Body mass index (BMI) 29.0-29.9, adult: Secondary | ICD-10-CM | POA: Diagnosis not present

## 2023-06-02 DIAGNOSIS — Z79891 Long term (current) use of opiate analgesic: Secondary | ICD-10-CM | POA: Diagnosis not present

## 2023-06-07 DIAGNOSIS — O321XX1 Maternal care for breech presentation, fetus 1: Secondary | ICD-10-CM | POA: Diagnosis not present

## 2023-06-07 DIAGNOSIS — O365931 Maternal care for other known or suspected poor fetal growth, third trimester, fetus 1: Secondary | ICD-10-CM | POA: Diagnosis not present

## 2023-06-21 DIAGNOSIS — O365931 Maternal care for other known or suspected poor fetal growth, third trimester, fetus 1: Secondary | ICD-10-CM | POA: Diagnosis not present

## 2023-06-22 DIAGNOSIS — F909 Attention-deficit hyperactivity disorder, unspecified type: Secondary | ICD-10-CM | POA: Diagnosis not present

## 2023-06-28 DIAGNOSIS — O365931 Maternal care for other known or suspected poor fetal growth, third trimester, fetus 1: Secondary | ICD-10-CM | POA: Diagnosis not present

## 2023-06-28 DIAGNOSIS — O139 Gestational [pregnancy-induced] hypertension without significant proteinuria, unspecified trimester: Secondary | ICD-10-CM | POA: Diagnosis not present

## 2023-06-29 DIAGNOSIS — O99320 Drug use complicating pregnancy, unspecified trimester: Secondary | ICD-10-CM | POA: Diagnosis not present

## 2023-06-29 DIAGNOSIS — F411 Generalized anxiety disorder: Secondary | ICD-10-CM | POA: Diagnosis not present

## 2023-06-29 DIAGNOSIS — F1120 Opioid dependence, uncomplicated: Secondary | ICD-10-CM | POA: Diagnosis not present

## 2023-06-29 DIAGNOSIS — F331 Major depressive disorder, recurrent, moderate: Secondary | ICD-10-CM | POA: Diagnosis not present

## 2023-07-06 DIAGNOSIS — O365931 Maternal care for other known or suspected poor fetal growth, third trimester, fetus 1: Secondary | ICD-10-CM | POA: Diagnosis not present

## 2023-07-06 DIAGNOSIS — Z3685 Encounter for antenatal screening for Streptococcus B: Secondary | ICD-10-CM | POA: Diagnosis not present

## 2023-07-06 DIAGNOSIS — Z3689 Encounter for other specified antenatal screening: Secondary | ICD-10-CM | POA: Diagnosis not present

## 2023-07-22 DIAGNOSIS — Z051 Observation and evaluation of newborn for suspected infectious condition ruled out: Secondary | ICD-10-CM | POA: Diagnosis not present

## 2023-07-22 DIAGNOSIS — O1214 Gestational proteinuria, complicating childbirth: Secondary | ICD-10-CM | POA: Diagnosis not present

## 2023-07-22 DIAGNOSIS — E063 Autoimmune thyroiditis: Secondary | ICD-10-CM | POA: Diagnosis not present

## 2023-07-22 DIAGNOSIS — Z302 Encounter for sterilization: Secondary | ICD-10-CM | POA: Diagnosis not present

## 2023-07-22 DIAGNOSIS — Z3A39 39 weeks gestation of pregnancy: Secondary | ICD-10-CM | POA: Diagnosis not present

## 2023-07-22 DIAGNOSIS — F1120 Opioid dependence, uncomplicated: Secondary | ICD-10-CM | POA: Diagnosis not present

## 2023-07-22 DIAGNOSIS — O164 Unspecified maternal hypertension, complicating childbirth: Secondary | ICD-10-CM | POA: Diagnosis not present

## 2023-07-22 DIAGNOSIS — O43893 Other placental disorders, third trimester: Secondary | ICD-10-CM | POA: Diagnosis not present

## 2023-07-22 DIAGNOSIS — O99284 Endocrine, nutritional and metabolic diseases complicating childbirth: Secondary | ICD-10-CM | POA: Diagnosis not present

## 2023-07-22 DIAGNOSIS — O34211 Maternal care for low transverse scar from previous cesarean delivery: Secondary | ICD-10-CM | POA: Diagnosis not present

## 2023-07-22 DIAGNOSIS — Z3A37 37 weeks gestation of pregnancy: Secondary | ICD-10-CM | POA: Diagnosis not present

## 2023-07-22 DIAGNOSIS — Z87891 Personal history of nicotine dependence: Secondary | ICD-10-CM | POA: Diagnosis not present

## 2023-07-22 DIAGNOSIS — O99324 Drug use complicating childbirth: Secondary | ICD-10-CM | POA: Diagnosis not present

## 2023-07-26 DIAGNOSIS — Z302 Encounter for sterilization: Secondary | ICD-10-CM | POA: Diagnosis not present

## 2023-07-26 DIAGNOSIS — R609 Edema, unspecified: Secondary | ICD-10-CM | POA: Diagnosis not present

## 2023-07-27 DIAGNOSIS — F411 Generalized anxiety disorder: Secondary | ICD-10-CM | POA: Diagnosis not present

## 2023-07-27 DIAGNOSIS — R918 Other nonspecific abnormal finding of lung field: Secondary | ICD-10-CM | POA: Diagnosis not present

## 2023-07-27 DIAGNOSIS — O99320 Drug use complicating pregnancy, unspecified trimester: Secondary | ICD-10-CM | POA: Diagnosis not present

## 2023-07-27 DIAGNOSIS — Z98891 History of uterine scar from previous surgery: Secondary | ICD-10-CM | POA: Diagnosis not present

## 2023-07-27 DIAGNOSIS — O1495 Unspecified pre-eclampsia, complicating the puerperium: Secondary | ICD-10-CM | POA: Diagnosis not present

## 2023-07-27 DIAGNOSIS — Z9889 Other specified postprocedural states: Secondary | ICD-10-CM | POA: Diagnosis not present

## 2023-07-27 DIAGNOSIS — E039 Hypothyroidism, unspecified: Secondary | ICD-10-CM | POA: Diagnosis not present

## 2023-07-27 DIAGNOSIS — O1415 Severe pre-eclampsia, complicating the puerperium: Secondary | ICD-10-CM | POA: Diagnosis not present

## 2023-07-27 DIAGNOSIS — O99215 Obesity complicating the puerperium: Secondary | ICD-10-CM | POA: Diagnosis not present

## 2023-07-27 DIAGNOSIS — O99345 Other mental disorders complicating the puerperium: Secondary | ICD-10-CM | POA: Diagnosis not present

## 2023-07-27 DIAGNOSIS — F1120 Opioid dependence, uncomplicated: Secondary | ICD-10-CM | POA: Diagnosis not present

## 2023-07-27 DIAGNOSIS — F419 Anxiety disorder, unspecified: Secondary | ICD-10-CM | POA: Diagnosis not present

## 2023-07-27 DIAGNOSIS — F32A Depression, unspecified: Secondary | ICD-10-CM | POA: Diagnosis not present

## 2023-07-27 DIAGNOSIS — F331 Major depressive disorder, recurrent, moderate: Secondary | ICD-10-CM | POA: Diagnosis not present

## 2023-07-27 DIAGNOSIS — Z79890 Hormone replacement therapy (postmenopausal): Secondary | ICD-10-CM | POA: Diagnosis not present

## 2023-07-27 DIAGNOSIS — F988 Other specified behavioral and emotional disorders with onset usually occurring in childhood and adolescence: Secondary | ICD-10-CM | POA: Diagnosis not present

## 2023-07-27 DIAGNOSIS — O99285 Endocrine, nutritional and metabolic diseases complicating the puerperium: Secondary | ICD-10-CM | POA: Diagnosis not present

## 2023-08-09 DIAGNOSIS — I10 Essential (primary) hypertension: Secondary | ICD-10-CM | POA: Diagnosis not present

## 2023-08-10 DIAGNOSIS — F909 Attention-deficit hyperactivity disorder, unspecified type: Secondary | ICD-10-CM | POA: Diagnosis not present

## 2023-08-16 DIAGNOSIS — M5489 Other dorsalgia: Secondary | ICD-10-CM | POA: Diagnosis not present

## 2023-08-24 DIAGNOSIS — Z79891 Long term (current) use of opiate analgesic: Secondary | ICD-10-CM | POA: Diagnosis not present

## 2023-08-24 DIAGNOSIS — Z6829 Body mass index (BMI) 29.0-29.9, adult: Secondary | ICD-10-CM | POA: Diagnosis not present

## 2023-08-24 DIAGNOSIS — F1120 Opioid dependence, uncomplicated: Secondary | ICD-10-CM | POA: Diagnosis not present

## 2023-08-24 DIAGNOSIS — F411 Generalized anxiety disorder: Secondary | ICD-10-CM | POA: Diagnosis not present

## 2023-08-24 DIAGNOSIS — F331 Major depressive disorder, recurrent, moderate: Secondary | ICD-10-CM | POA: Diagnosis not present

## 2023-09-01 DIAGNOSIS — F53 Postpartum depression: Secondary | ICD-10-CM | POA: Diagnosis not present

## 2023-09-07 DIAGNOSIS — F909 Attention-deficit hyperactivity disorder, unspecified type: Secondary | ICD-10-CM | POA: Diagnosis not present

## 2023-09-09 DIAGNOSIS — R300 Dysuria: Secondary | ICD-10-CM | POA: Diagnosis not present

## 2023-09-12 DIAGNOSIS — K838 Other specified diseases of biliary tract: Secondary | ICD-10-CM | POA: Diagnosis not present

## 2023-09-12 DIAGNOSIS — Z79890 Hormone replacement therapy (postmenopausal): Secondary | ICD-10-CM | POA: Diagnosis not present

## 2023-09-12 DIAGNOSIS — E063 Autoimmune thyroiditis: Secondary | ICD-10-CM | POA: Diagnosis not present

## 2023-09-12 DIAGNOSIS — I10 Essential (primary) hypertension: Secondary | ICD-10-CM | POA: Diagnosis not present

## 2023-09-12 DIAGNOSIS — R109 Unspecified abdominal pain: Secondary | ICD-10-CM | POA: Diagnosis not present

## 2023-09-12 DIAGNOSIS — Z79899 Other long term (current) drug therapy: Secondary | ICD-10-CM | POA: Diagnosis not present

## 2023-09-12 DIAGNOSIS — R112 Nausea with vomiting, unspecified: Secondary | ICD-10-CM | POA: Diagnosis not present

## 2023-09-12 DIAGNOSIS — Z888 Allergy status to other drugs, medicaments and biological substances status: Secondary | ICD-10-CM | POA: Diagnosis not present

## 2023-09-12 DIAGNOSIS — K760 Fatty (change of) liver, not elsewhere classified: Secondary | ICD-10-CM | POA: Diagnosis not present

## 2023-09-12 DIAGNOSIS — K8001 Calculus of gallbladder with acute cholecystitis with obstruction: Secondary | ICD-10-CM | POA: Diagnosis not present

## 2023-09-12 DIAGNOSIS — F419 Anxiety disorder, unspecified: Secondary | ICD-10-CM | POA: Diagnosis not present

## 2023-09-12 DIAGNOSIS — K8012 Calculus of gallbladder with acute and chronic cholecystitis without obstruction: Secondary | ICD-10-CM | POA: Diagnosis not present

## 2023-09-12 DIAGNOSIS — K810 Acute cholecystitis: Secondary | ICD-10-CM | POA: Diagnosis not present

## 2023-09-12 DIAGNOSIS — R1013 Epigastric pain: Secondary | ICD-10-CM | POA: Diagnosis not present

## 2023-09-12 DIAGNOSIS — K8020 Calculus of gallbladder without cholecystitis without obstruction: Secondary | ICD-10-CM | POA: Diagnosis not present

## 2023-09-13 DIAGNOSIS — K838 Other specified diseases of biliary tract: Secondary | ICD-10-CM | POA: Diagnosis not present

## 2023-09-13 DIAGNOSIS — I10 Essential (primary) hypertension: Secondary | ICD-10-CM | POA: Diagnosis not present

## 2023-09-13 DIAGNOSIS — E063 Autoimmune thyroiditis: Secondary | ICD-10-CM | POA: Diagnosis not present

## 2023-09-13 DIAGNOSIS — K8012 Calculus of gallbladder with acute and chronic cholecystitis without obstruction: Secondary | ICD-10-CM | POA: Diagnosis not present

## 2023-09-13 DIAGNOSIS — K8000 Calculus of gallbladder with acute cholecystitis without obstruction: Secondary | ICD-10-CM | POA: Diagnosis not present

## 2023-09-13 DIAGNOSIS — Z79890 Hormone replacement therapy (postmenopausal): Secondary | ICD-10-CM | POA: Diagnosis not present

## 2023-09-15 DIAGNOSIS — F988 Other specified behavioral and emotional disorders with onset usually occurring in childhood and adolescence: Secondary | ICD-10-CM | POA: Diagnosis not present

## 2023-09-15 DIAGNOSIS — Z6830 Body mass index (BMI) 30.0-30.9, adult: Secondary | ICD-10-CM | POA: Diagnosis not present

## 2023-09-15 DIAGNOSIS — F331 Major depressive disorder, recurrent, moderate: Secondary | ICD-10-CM | POA: Diagnosis not present

## 2023-09-21 DIAGNOSIS — F1120 Opioid dependence, uncomplicated: Secondary | ICD-10-CM | POA: Diagnosis not present

## 2023-09-21 DIAGNOSIS — F411 Generalized anxiety disorder: Secondary | ICD-10-CM | POA: Diagnosis not present

## 2023-09-21 DIAGNOSIS — F331 Major depressive disorder, recurrent, moderate: Secondary | ICD-10-CM | POA: Diagnosis not present

## 2023-09-23 DIAGNOSIS — N644 Mastodynia: Secondary | ICD-10-CM | POA: Diagnosis not present

## 2023-09-23 DIAGNOSIS — E063 Autoimmune thyroiditis: Secondary | ICD-10-CM | POA: Diagnosis not present

## 2023-09-23 DIAGNOSIS — E038 Other specified hypothyroidism: Secondary | ICD-10-CM | POA: Diagnosis not present

## 2023-09-23 DIAGNOSIS — F909 Attention-deficit hyperactivity disorder, unspecified type: Secondary | ICD-10-CM | POA: Diagnosis not present

## 2023-09-28 DIAGNOSIS — Z09 Encounter for follow-up examination after completed treatment for conditions other than malignant neoplasm: Secondary | ICD-10-CM | POA: Diagnosis not present

## 2023-10-12 DIAGNOSIS — F988 Other specified behavioral and emotional disorders with onset usually occurring in childhood and adolescence: Secondary | ICD-10-CM | POA: Diagnosis not present

## 2023-10-12 DIAGNOSIS — Z6829 Body mass index (BMI) 29.0-29.9, adult: Secondary | ICD-10-CM | POA: Diagnosis not present

## 2023-10-12 DIAGNOSIS — M545 Low back pain, unspecified: Secondary | ICD-10-CM | POA: Diagnosis not present

## 2023-10-19 DIAGNOSIS — F1120 Opioid dependence, uncomplicated: Secondary | ICD-10-CM | POA: Diagnosis not present

## 2023-10-19 DIAGNOSIS — F331 Major depressive disorder, recurrent, moderate: Secondary | ICD-10-CM | POA: Diagnosis not present

## 2023-10-19 DIAGNOSIS — Z79891 Long term (current) use of opiate analgesic: Secondary | ICD-10-CM | POA: Diagnosis not present

## 2023-10-19 DIAGNOSIS — F411 Generalized anxiety disorder: Secondary | ICD-10-CM | POA: Diagnosis not present

## 2023-10-20 DIAGNOSIS — F909 Attention-deficit hyperactivity disorder, unspecified type: Secondary | ICD-10-CM | POA: Diagnosis not present

## 2023-10-29 DIAGNOSIS — F411 Generalized anxiety disorder: Secondary | ICD-10-CM | POA: Diagnosis not present

## 2023-10-29 DIAGNOSIS — F988 Other specified behavioral and emotional disorders with onset usually occurring in childhood and adolescence: Secondary | ICD-10-CM | POA: Diagnosis not present

## 2023-10-29 DIAGNOSIS — Z6829 Body mass index (BMI) 29.0-29.9, adult: Secondary | ICD-10-CM | POA: Diagnosis not present

## 2023-11-16 DIAGNOSIS — F1120 Opioid dependence, uncomplicated: Secondary | ICD-10-CM | POA: Diagnosis not present

## 2023-11-16 DIAGNOSIS — F331 Major depressive disorder, recurrent, moderate: Secondary | ICD-10-CM | POA: Diagnosis not present

## 2023-11-16 DIAGNOSIS — F411 Generalized anxiety disorder: Secondary | ICD-10-CM | POA: Diagnosis not present

## 2023-12-15 DIAGNOSIS — F411 Generalized anxiety disorder: Secondary | ICD-10-CM | POA: Diagnosis not present

## 2023-12-15 DIAGNOSIS — F1120 Opioid dependence, uncomplicated: Secondary | ICD-10-CM | POA: Diagnosis not present

## 2023-12-15 DIAGNOSIS — Z6829 Body mass index (BMI) 29.0-29.9, adult: Secondary | ICD-10-CM | POA: Diagnosis not present

## 2023-12-30 DIAGNOSIS — R61 Generalized hyperhidrosis: Secondary | ICD-10-CM | POA: Diagnosis not present

## 2023-12-30 DIAGNOSIS — F411 Generalized anxiety disorder: Secondary | ICD-10-CM | POA: Diagnosis not present

## 2023-12-30 DIAGNOSIS — F331 Major depressive disorder, recurrent, moderate: Secondary | ICD-10-CM | POA: Diagnosis not present

## 2023-12-30 DIAGNOSIS — F988 Other specified behavioral and emotional disorders with onset usually occurring in childhood and adolescence: Secondary | ICD-10-CM | POA: Diagnosis not present

## 2023-12-31 DIAGNOSIS — F909 Attention-deficit hyperactivity disorder, unspecified type: Secondary | ICD-10-CM | POA: Diagnosis not present

## 2024-01-12 DIAGNOSIS — F411 Generalized anxiety disorder: Secondary | ICD-10-CM | POA: Diagnosis not present

## 2024-01-12 DIAGNOSIS — F1120 Opioid dependence, uncomplicated: Secondary | ICD-10-CM | POA: Diagnosis not present

## 2024-01-12 DIAGNOSIS — F331 Major depressive disorder, recurrent, moderate: Secondary | ICD-10-CM | POA: Diagnosis not present

## 2024-01-24 DIAGNOSIS — F909 Attention-deficit hyperactivity disorder, unspecified type: Secondary | ICD-10-CM | POA: Diagnosis not present

## 2024-02-02 DIAGNOSIS — Z6830 Body mass index (BMI) 30.0-30.9, adult: Secondary | ICD-10-CM | POA: Diagnosis not present

## 2024-02-02 DIAGNOSIS — R3915 Urgency of urination: Secondary | ICD-10-CM | POA: Diagnosis not present

## 2024-02-02 DIAGNOSIS — F988 Other specified behavioral and emotional disorders with onset usually occurring in childhood and adolescence: Secondary | ICD-10-CM | POA: Diagnosis not present

## 2024-02-02 DIAGNOSIS — F331 Major depressive disorder, recurrent, moderate: Secondary | ICD-10-CM | POA: Diagnosis not present

## 2024-02-23 DIAGNOSIS — F909 Attention-deficit hyperactivity disorder, unspecified type: Secondary | ICD-10-CM | POA: Diagnosis not present

## 2024-03-03 DIAGNOSIS — Z79891 Long term (current) use of opiate analgesic: Secondary | ICD-10-CM | POA: Diagnosis not present

## 2024-03-03 DIAGNOSIS — F411 Generalized anxiety disorder: Secondary | ICD-10-CM | POA: Diagnosis not present

## 2024-03-03 DIAGNOSIS — F1120 Opioid dependence, uncomplicated: Secondary | ICD-10-CM | POA: Diagnosis not present

## 2024-03-03 DIAGNOSIS — Z6829 Body mass index (BMI) 29.0-29.9, adult: Secondary | ICD-10-CM | POA: Diagnosis not present

## 2024-03-15 DIAGNOSIS — E063 Autoimmune thyroiditis: Secondary | ICD-10-CM | POA: Diagnosis not present

## 2024-03-23 DIAGNOSIS — F909 Attention-deficit hyperactivity disorder, unspecified type: Secondary | ICD-10-CM | POA: Diagnosis not present

## 2024-03-31 DIAGNOSIS — F331 Major depressive disorder, recurrent, moderate: Secondary | ICD-10-CM | POA: Diagnosis not present

## 2024-03-31 DIAGNOSIS — F411 Generalized anxiety disorder: Secondary | ICD-10-CM | POA: Diagnosis not present

## 2024-03-31 DIAGNOSIS — F1120 Opioid dependence, uncomplicated: Secondary | ICD-10-CM | POA: Diagnosis not present

## 2024-04-03 DIAGNOSIS — F411 Generalized anxiety disorder: Secondary | ICD-10-CM | POA: Diagnosis not present

## 2024-04-03 DIAGNOSIS — F988 Other specified behavioral and emotional disorders with onset usually occurring in childhood and adolescence: Secondary | ICD-10-CM | POA: Diagnosis not present

## 2024-04-03 DIAGNOSIS — Z6829 Body mass index (BMI) 29.0-29.9, adult: Secondary | ICD-10-CM | POA: Diagnosis not present

## 2024-04-03 DIAGNOSIS — L309 Dermatitis, unspecified: Secondary | ICD-10-CM | POA: Diagnosis not present

## 2024-04-20 DIAGNOSIS — F909 Attention-deficit hyperactivity disorder, unspecified type: Secondary | ICD-10-CM | POA: Diagnosis not present

## 2024-05-15 DIAGNOSIS — S61442A Puncture wound with foreign body of left hand, initial encounter: Secondary | ICD-10-CM | POA: Diagnosis not present

## 2024-05-15 DIAGNOSIS — W5501XA Bitten by cat, initial encounter: Secondary | ICD-10-CM | POA: Diagnosis not present

## 2024-05-17 DIAGNOSIS — F909 Attention-deficit hyperactivity disorder, unspecified type: Secondary | ICD-10-CM | POA: Diagnosis not present

## 2024-05-31 DIAGNOSIS — F988 Other specified behavioral and emotional disorders with onset usually occurring in childhood and adolescence: Secondary | ICD-10-CM | POA: Diagnosis not present

## 2024-05-31 DIAGNOSIS — Z6829 Body mass index (BMI) 29.0-29.9, adult: Secondary | ICD-10-CM | POA: Diagnosis not present

## 2024-05-31 DIAGNOSIS — F39 Unspecified mood [affective] disorder: Secondary | ICD-10-CM | POA: Diagnosis not present

## 2024-06-07 DIAGNOSIS — Z6829 Body mass index (BMI) 29.0-29.9, adult: Secondary | ICD-10-CM | POA: Diagnosis not present

## 2024-06-07 DIAGNOSIS — F1120 Opioid dependence, uncomplicated: Secondary | ICD-10-CM | POA: Diagnosis not present

## 2024-06-07 DIAGNOSIS — Z79891 Long term (current) use of opiate analgesic: Secondary | ICD-10-CM | POA: Diagnosis not present

## 2024-06-07 DIAGNOSIS — F411 Generalized anxiety disorder: Secondary | ICD-10-CM | POA: Diagnosis not present

## 2024-06-22 DIAGNOSIS — F909 Attention-deficit hyperactivity disorder, unspecified type: Secondary | ICD-10-CM | POA: Diagnosis not present

## 2024-07-05 DIAGNOSIS — F1120 Opioid dependence, uncomplicated: Secondary | ICD-10-CM | POA: Diagnosis not present

## 2024-07-05 DIAGNOSIS — F331 Major depressive disorder, recurrent, moderate: Secondary | ICD-10-CM | POA: Diagnosis not present

## 2024-07-05 DIAGNOSIS — F411 Generalized anxiety disorder: Secondary | ICD-10-CM | POA: Diagnosis not present

## 2024-07-18 DIAGNOSIS — F909 Attention-deficit hyperactivity disorder, unspecified type: Secondary | ICD-10-CM | POA: Diagnosis not present

## 2024-08-01 DIAGNOSIS — F331 Major depressive disorder, recurrent, moderate: Secondary | ICD-10-CM | POA: Diagnosis not present

## 2024-08-01 DIAGNOSIS — Z6828 Body mass index (BMI) 28.0-28.9, adult: Secondary | ICD-10-CM | POA: Diagnosis not present

## 2024-08-01 DIAGNOSIS — E785 Hyperlipidemia, unspecified: Secondary | ICD-10-CM | POA: Diagnosis not present

## 2024-08-01 DIAGNOSIS — F988 Other specified behavioral and emotional disorders with onset usually occurring in childhood and adolescence: Secondary | ICD-10-CM | POA: Diagnosis not present

## 2024-08-02 DIAGNOSIS — Z79891 Long term (current) use of opiate analgesic: Secondary | ICD-10-CM | POA: Diagnosis not present

## 2024-08-02 DIAGNOSIS — F1120 Opioid dependence, uncomplicated: Secondary | ICD-10-CM | POA: Diagnosis not present

## 2024-08-02 DIAGNOSIS — Z6829 Body mass index (BMI) 29.0-29.9, adult: Secondary | ICD-10-CM | POA: Diagnosis not present

## 2024-08-02 DIAGNOSIS — F411 Generalized anxiety disorder: Secondary | ICD-10-CM | POA: Diagnosis not present

## 2024-08-15 DIAGNOSIS — F331 Major depressive disorder, recurrent, moderate: Secondary | ICD-10-CM | POA: Diagnosis not present

## 2024-08-15 DIAGNOSIS — F909 Attention-deficit hyperactivity disorder, unspecified type: Secondary | ICD-10-CM | POA: Diagnosis not present

## 2024-08-24 DIAGNOSIS — S99921A Unspecified injury of right foot, initial encounter: Secondary | ICD-10-CM | POA: Diagnosis not present

## 2024-08-24 DIAGNOSIS — M79671 Pain in right foot: Secondary | ICD-10-CM | POA: Diagnosis not present

## 2024-08-30 DIAGNOSIS — F331 Major depressive disorder, recurrent, moderate: Secondary | ICD-10-CM | POA: Diagnosis not present

## 2024-08-30 DIAGNOSIS — F411 Generalized anxiety disorder: Secondary | ICD-10-CM | POA: Diagnosis not present

## 2024-08-30 DIAGNOSIS — F1120 Opioid dependence, uncomplicated: Secondary | ICD-10-CM | POA: Diagnosis not present

## 2024-09-06 DIAGNOSIS — F909 Attention-deficit hyperactivity disorder, unspecified type: Secondary | ICD-10-CM | POA: Diagnosis not present

## 2024-10-04 DIAGNOSIS — Z6829 Body mass index (BMI) 29.0-29.9, adult: Secondary | ICD-10-CM | POA: Diagnosis not present

## 2024-10-04 DIAGNOSIS — F1120 Opioid dependence, uncomplicated: Secondary | ICD-10-CM | POA: Diagnosis not present

## 2024-10-04 DIAGNOSIS — Z79891 Long term (current) use of opiate analgesic: Secondary | ICD-10-CM | POA: Diagnosis not present

## 2024-10-04 DIAGNOSIS — F411 Generalized anxiety disorder: Secondary | ICD-10-CM | POA: Diagnosis not present

## 2024-10-08 DIAGNOSIS — R350 Frequency of micturition: Secondary | ICD-10-CM | POA: Diagnosis not present

## 2024-10-08 DIAGNOSIS — R10A1 Flank pain, right side: Secondary | ICD-10-CM | POA: Diagnosis not present

## 2024-10-08 DIAGNOSIS — R319 Hematuria, unspecified: Secondary | ICD-10-CM | POA: Diagnosis not present

## 2024-10-11 DIAGNOSIS — F909 Attention-deficit hyperactivity disorder, unspecified type: Secondary | ICD-10-CM | POA: Diagnosis not present

## 2024-10-13 DIAGNOSIS — E039 Hypothyroidism, unspecified: Secondary | ICD-10-CM | POA: Diagnosis not present

## 2024-10-13 DIAGNOSIS — F39 Unspecified mood [affective] disorder: Secondary | ICD-10-CM | POA: Diagnosis not present

## 2024-10-13 DIAGNOSIS — F988 Other specified behavioral and emotional disorders with onset usually occurring in childhood and adolescence: Secondary | ICD-10-CM | POA: Diagnosis not present

## 2024-10-13 DIAGNOSIS — Z79899 Other long term (current) drug therapy: Secondary | ICD-10-CM | POA: Diagnosis not present

## 2024-10-13 DIAGNOSIS — Z23 Encounter for immunization: Secondary | ICD-10-CM | POA: Diagnosis not present

## 2024-10-13 DIAGNOSIS — E785 Hyperlipidemia, unspecified: Secondary | ICD-10-CM | POA: Diagnosis not present

## 2024-11-01 DIAGNOSIS — F411 Generalized anxiety disorder: Secondary | ICD-10-CM | POA: Diagnosis not present

## 2024-11-01 DIAGNOSIS — F1120 Opioid dependence, uncomplicated: Secondary | ICD-10-CM | POA: Diagnosis not present

## 2024-11-01 DIAGNOSIS — F331 Major depressive disorder, recurrent, moderate: Secondary | ICD-10-CM | POA: Diagnosis not present

## 2024-12-01 DIAGNOSIS — F331 Major depressive disorder, recurrent, moderate: Secondary | ICD-10-CM | POA: Diagnosis not present

## 2024-12-13 DIAGNOSIS — F988 Other specified behavioral and emotional disorders with onset usually occurring in childhood and adolescence: Secondary | ICD-10-CM | POA: Diagnosis not present

## 2024-12-13 DIAGNOSIS — E785 Hyperlipidemia, unspecified: Secondary | ICD-10-CM | POA: Diagnosis not present

## 2024-12-13 DIAGNOSIS — F39 Unspecified mood [affective] disorder: Secondary | ICD-10-CM | POA: Diagnosis not present

## 2024-12-13 DIAGNOSIS — Z6831 Body mass index (BMI) 31.0-31.9, adult: Secondary | ICD-10-CM | POA: Diagnosis not present

## 2024-12-15 DIAGNOSIS — F411 Generalized anxiety disorder: Secondary | ICD-10-CM | POA: Diagnosis not present

## 2024-12-15 DIAGNOSIS — Z79891 Long term (current) use of opiate analgesic: Secondary | ICD-10-CM | POA: Diagnosis not present

## 2024-12-15 DIAGNOSIS — Z6831 Body mass index (BMI) 31.0-31.9, adult: Secondary | ICD-10-CM | POA: Diagnosis not present

## 2024-12-15 DIAGNOSIS — F1120 Opioid dependence, uncomplicated: Secondary | ICD-10-CM | POA: Diagnosis not present

## 2024-12-15 DIAGNOSIS — E66811 Obesity, class 1: Secondary | ICD-10-CM | POA: Diagnosis not present
